# Patient Record
Sex: Female | Born: 2000 | Race: Black or African American | Hispanic: No | Marital: Single
Health system: Southern US, Community
[De-identification: ages and names within clinical notes are randomized; demographics above are authoritative.]

---
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
## 2020-01-19 ENCOUNTER — Other Ambulatory Visit: Payer: Self-pay

## 2020-01-19 ENCOUNTER — Encounter (HOSPITAL_COMMUNITY): Payer: Self-pay | Admitting: Pharmacy Technician

## 2020-01-19 ENCOUNTER — Emergency Department (HOSPITAL_COMMUNITY)
Admission: EM | Admit: 2020-01-19 | Discharge: 2020-01-19 | Disposition: A | Discharge status: Home / Self Care | Payer: 59 | Attending: Emergency Medicine | Admitting: Emergency Medicine

## 2020-01-19 DIAGNOSIS — H5789 Other specified disorders of eye and adnexa: Secondary | ICD-10-CM | POA: Diagnosis not present

## 2020-01-19 DIAGNOSIS — Z5321 Procedure and treatment not carried out due to patient leaving prior to being seen by health care provider: Secondary | ICD-10-CM | POA: Diagnosis not present

## 2020-01-19 DIAGNOSIS — R519 Headache, unspecified: Secondary | ICD-10-CM | POA: Insufficient documentation

## 2020-01-19 NOTE — ED Notes (Signed)
Pt LWBS. Pt stated she was feeling better and didn't want to wait anymore.

## 2020-01-19 NOTE — ED Triage Notes (Signed)
Pt here pov with reports of headache and red eyes onset today. Pt states the headache woke her up out of her sleep. No weakness, slurred speech, or facial droop noted.
